# Patient Record
Sex: Female | Born: 1995 | Race: Black or African American | Hispanic: No | Marital: Single | Smoking: Never smoker
Health system: Southern US, Community
[De-identification: ages and names within clinical notes are randomized; demographics above are authoritative.]

## PROBLEM LIST (undated history)

## (undated) DIAGNOSIS — B019 Varicella without complication: Secondary | ICD-10-CM

## (undated) DIAGNOSIS — Z8613 Personal history of malaria: Secondary | ICD-10-CM

## (undated) DIAGNOSIS — G43909 Migraine, unspecified, not intractable, without status migrainosus: Secondary | ICD-10-CM

## (undated) HISTORY — PX: CHOLECYSTECTOMY, LAPAROSCOPIC: SHX56

## (undated) HISTORY — DX: Personal history of malaria: Z86.13

## (undated) HISTORY — DX: Migraine, unspecified, not intractable, without status migrainosus: G43.909

## (undated) HISTORY — DX: Varicella without complication: B01.9

---
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
## 2013-04-27 ENCOUNTER — Emergency Department (INDEPENDENT_AMBULATORY_CARE_PROVIDER_SITE_OTHER): Payer: 59

## 2013-04-27 ENCOUNTER — Encounter (HOSPITAL_COMMUNITY): Payer: Self-pay | Admitting: Emergency Medicine

## 2013-04-27 ENCOUNTER — Emergency Department (HOSPITAL_COMMUNITY)
Admission: EM | Admit: 2013-04-27 | Discharge: 2013-04-27 | Disposition: A | Discharge status: Home / Self Care | Payer: 59 | Source: Home / Self Care | Attending: Family Medicine | Admitting: Family Medicine

## 2013-04-27 DIAGNOSIS — S92109A Unspecified fracture of unspecified talus, initial encounter for closed fracture: Secondary | ICD-10-CM

## 2013-04-27 DIAGNOSIS — S92153A Displaced avulsion fracture (chip fracture) of unspecified talus, initial encounter for closed fracture: Secondary | ICD-10-CM

## 2013-04-27 NOTE — ED Notes (Signed)
Patient states was playing soccer yesterday and injured her left ankle Today swollen with some pain

## 2013-04-27 NOTE — Discharge Instructions (Signed)
Thank you for coming in today. Use the cam walker with or without crutches  Take up to 2 aleve twice daily for pain as needed.  [facility]  Ankle Fracture A fracture is a break in the bone. A cast or splint is used to protect and keep your injured bone from moving.  HOME CARE INSTRUCTIONS   Use your crutches as directed.  To lessen the swelling, keep the injured leg elevated while sitting or lying down.  Apply ice to the injury for 15-20 minutes, 03-04 times per day while awake for 2 days. Put the ice in a plastic bag and place a thin towel between the bag of ice and your cast.  If you have a plaster or fiberglass cast:  Do not try to scratch the skin under the cast using sharp or pointed objects.  Check the skin around the cast every day. You may put lotion on any red or sore areas.  Keep your cast dry and clean.  If you have a plaster splint:  Wear the splint as directed.  You may loosen the elastic around the splint if your toes become numb, tingle, or turn cold or blue.  Do not put pressure on any part of your cast or splint; it may break. Rest your cast only on a pillow the first 24 hours until it is fully hardened.  Your cast or splint can be protected during bathing with a plastic bag. Do not lower the cast or splint into water.  Take medications as directed by your caregiver. Only take over-the-counter or prescription medicines for pain, discomfort, or fever as directed by your caregiver.  Do not drive a vehicle until your caregiver specifically tells you it is safe to do so.  If your caregiver has given you a follow-up appointment, it is very important to keep that appointment. Not keeping the appointment could result in a chronic or permanent injury, pain, and disability. If there is any problem keeping the appointment, you must call back to this facility for assistance. SEEK IMMEDIATE MEDICAL CARE IF:   Your cast gets  damaged or breaks.  You have continued severe pain or more swelling than you did before the cast was put on.  Your skin or toenails below the injury turn blue or gray, or feel cold or numb.  There is a bad smell or new stains and/or purulent (pus like) drainage coming from under the cast. If you do not have a window in your cast for observing the wound, a discharge or minor bleeding may show up as a stain on the outside of your cast. Report these findings to your caregiver. MAKE SURE YOU:   Understand these instructions.  Will watch your condition.  Will get help right away if you are not doing well or get worse. Document Released: 01/14/2000 Document Revised: 04/10/2011 Document Reviewed: 08/15/2012 [facility] Patient Information 2014 [place], [place].

## 2013-04-27 NOTE — ED Provider Notes (Signed)
Jill [name] is a 18 y.o. female who presents to Urgent Care today for left ankle injury. Patient suffered an inversion injury yesterday playing soccer. She is lateral and medial ankle pain. Additionally she notes pain in her proximal fifth metatarsal. She is able to walk however pain is somewhat moderate. She denies any radiating pain weakness or numbness. She feels well otherwise.  She is a Consulting civil engineerstudent [employer]   Past Medical History  Diagnosis Date  . Chicken pox   . Migraines     menstrual trigger  . History of malaria 1995    [place] treated    History  Substance Use Topics  . Smoking status: Never Smoker   . Smokeless tobacco: Never Used  . Alcohol Use: No   ROS as above Medications: No current facility-administered medications for this encounter.   No current outpatient prescriptions on file.    Exam:  BP 124/79  Pulse 84  Temp(Src) 98.5 F (36.9 C) (Oral)  Resp 18  SpO2 96%  LMP 04/02/2013 Gen: Well NAD Left ankle: Swelling present across the anterior aspect of her ankle from the medial to lateral malleoli. She's tender at the medial talar this and ATFL insertion. She is mild tenderness at the base of the fifth metatarsal. The rest of the foot is nontender. Stable and good ligamentous testing. Motion is intact. Capillary refill sensation and pulses are intact distally.  No results found for this or any previous visit (from the past 24 hour(s)). Dg Ankle Complete Left  04/27/2013   CLINICAL DATA:  Inversion injury.  EXAM: LEFT ANKLE COMPLETE - 3+ VIEW  COMPARISON:  None.  FINDINGS: Diffuse soft tissue swelling. Linear bone density noted along the medial talus, likely avulsion fracture. No additional acute bony abnormality. No subluxation or dislocation. Joint spaces are maintained.  IMPRESSION: Small avulsion fracture off the medial talus.   Electronically Signed   By: Charlett NoseKevin  Dover M.D.   On: 04/27/2013 14:45   Dg Foot Complete Left  04/27/2013   CLINICAL DATA:   Right foot injury.  Pain.  EXAM: LEFT FOOT - COMPLETE 3+ VIEW  COMPARISON:  None.  FINDINGS: There is no evidence of fracture or dislocation. There is no evidence of arthropathy or other focal bone abnormality. Soft tissues are unremarkable.  IMPRESSION: Negative.   Electronically Signed   By: Amie Portlandavid  Ormond M.D.   On: 04/27/2013 14:45    Assessment and Plan: 18 y.o. female with avulsion fracture to the medial talus.  Plan for Cam Walker boot, ibuprofen and [facility]   Discussed warning signs or symptoms. Please see discharge instructions. Patient expresses understanding.    Rodolph BongEvan S Corey, MD 04/27/13 [phone]

## 2014-02-28 IMAGING — CR DG CHEST 2V
2 series · 2 of 2 positions shown · non-contrast
Comparison: None.

CLINICAL DATA: History of positive PPD test.

CHEST - 2 VIEW

[view not recorded (1 of 2)]
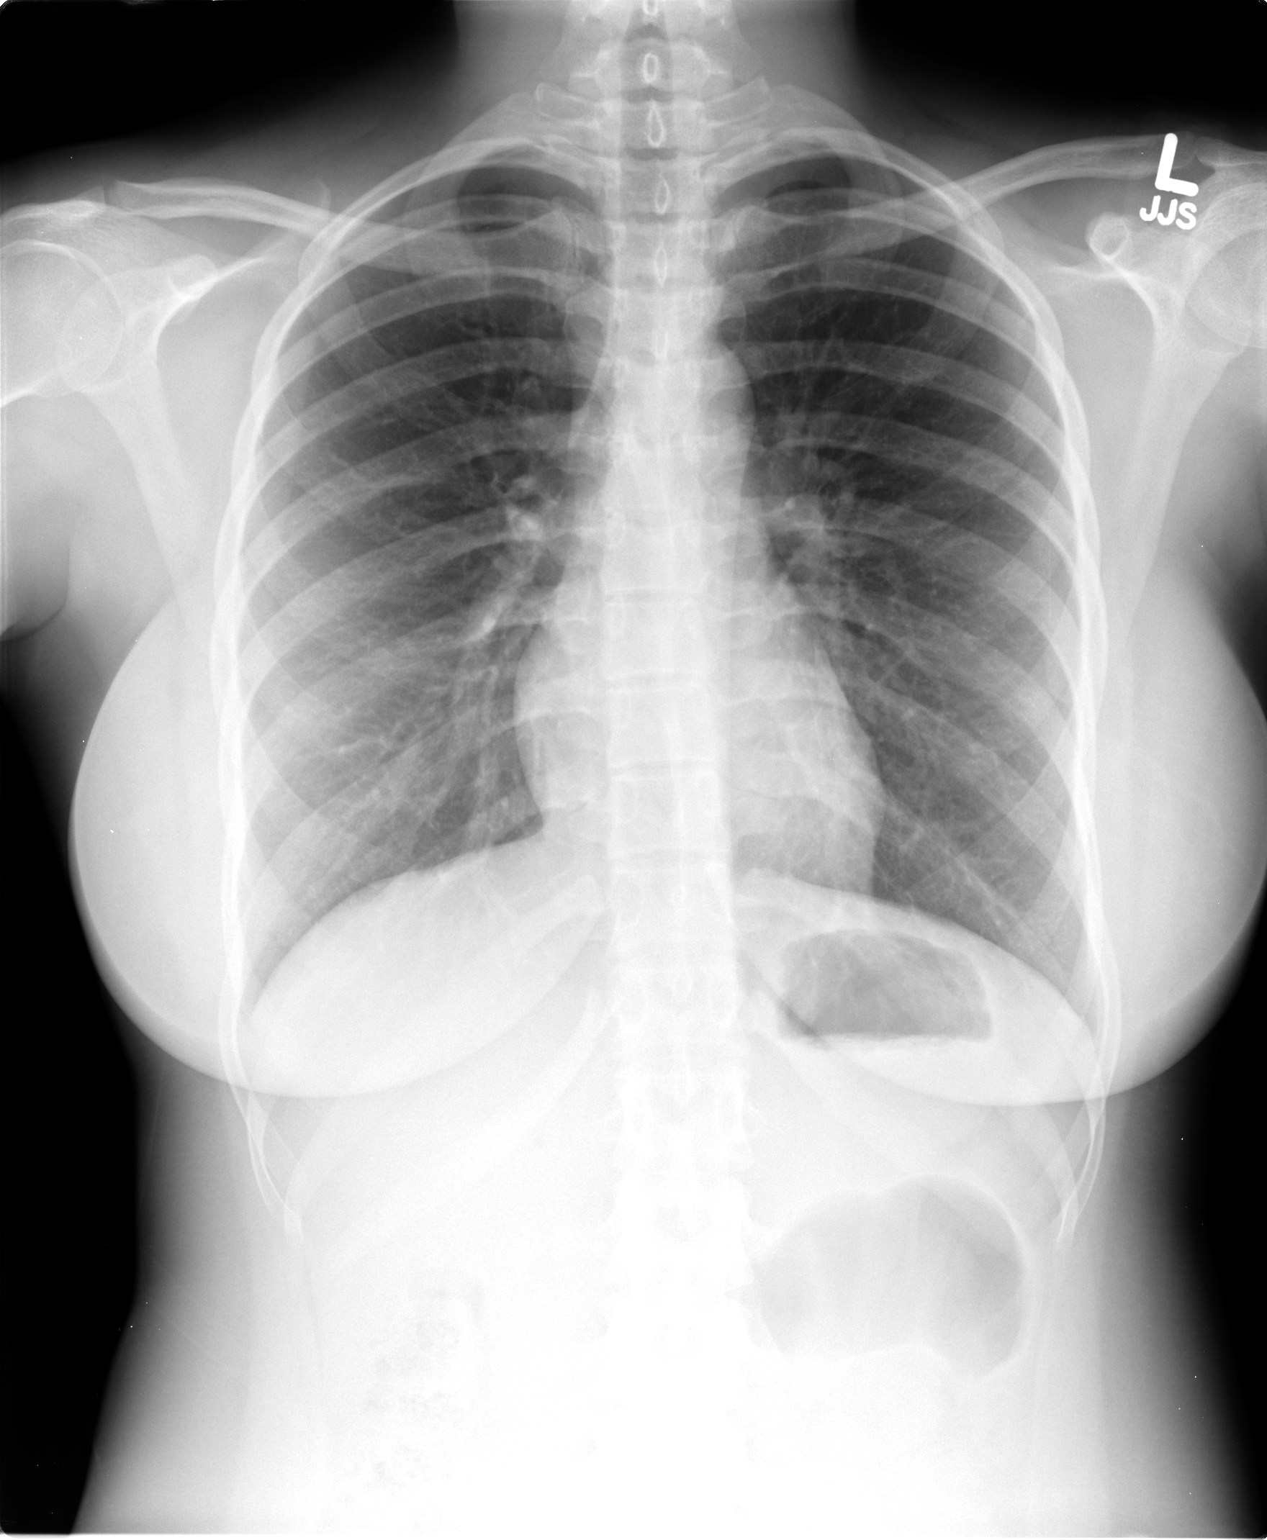

[view not recorded (2 of 2)]
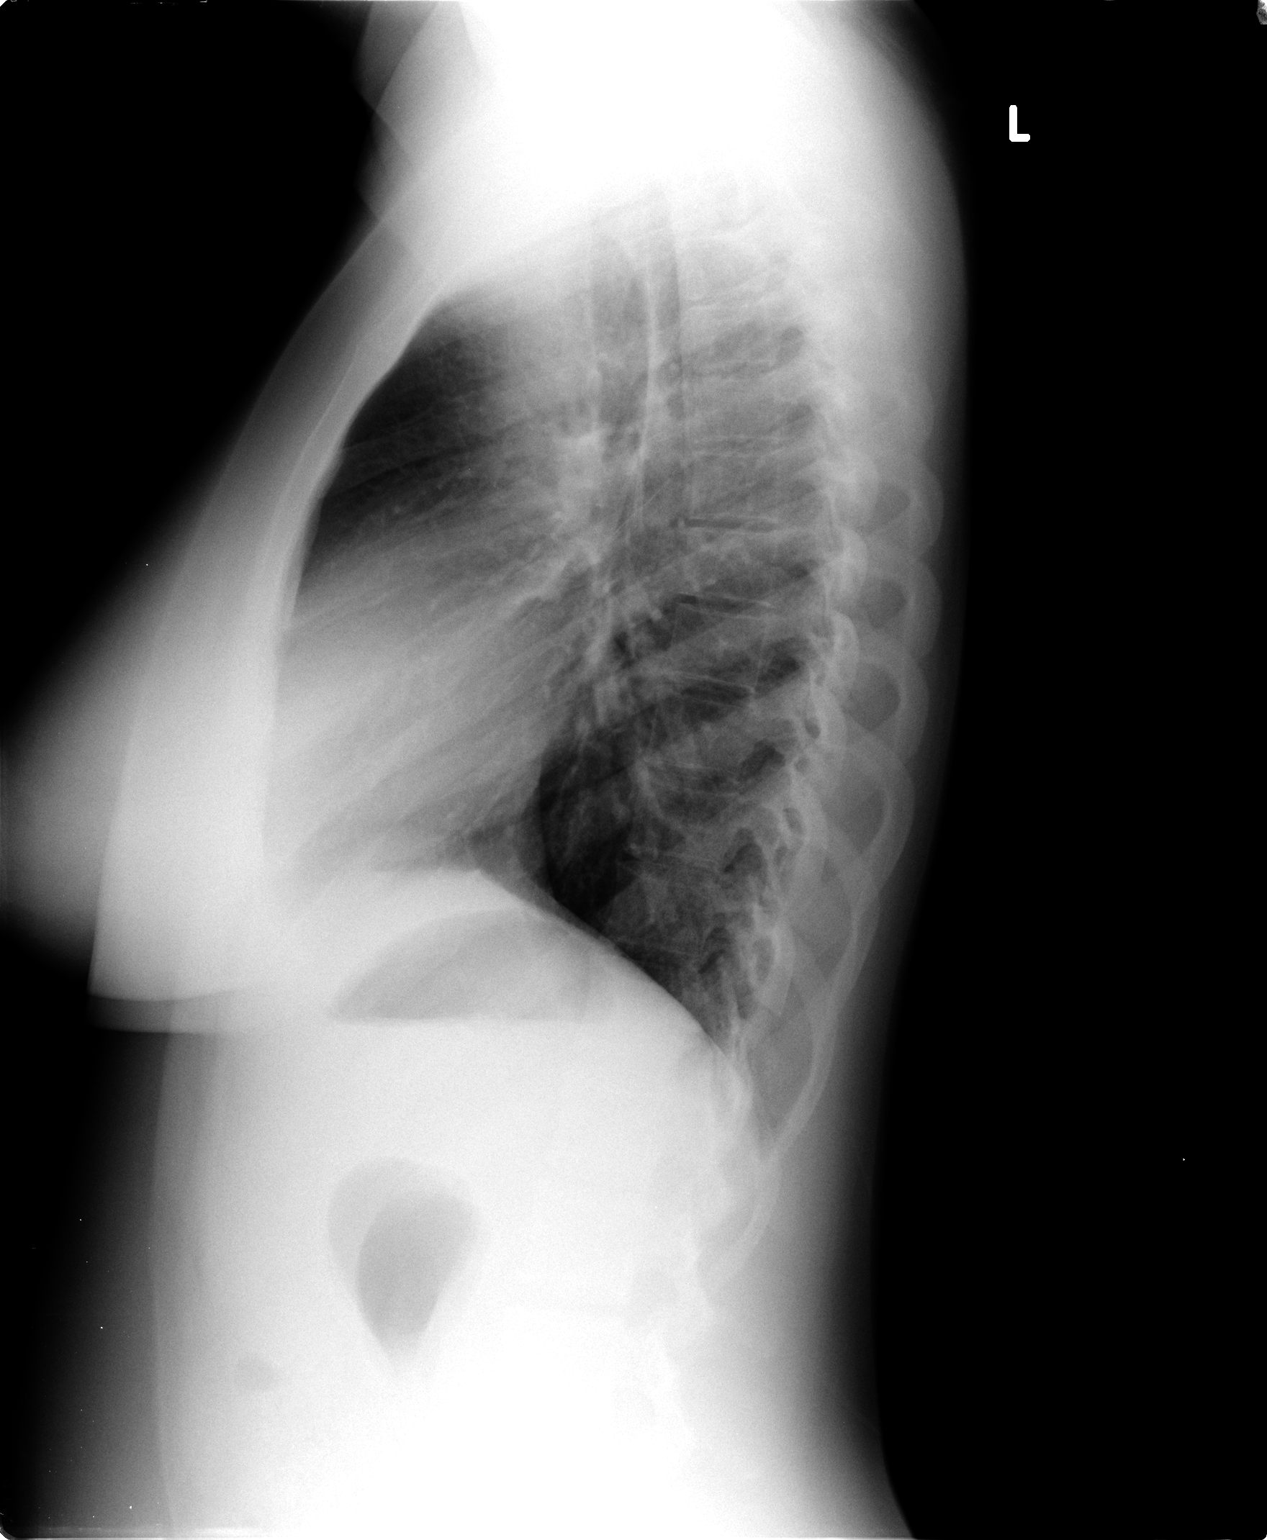

[2 of 2 positions shown; findings below may reference images not displayed]

FINDINGS: The cardiac silhouette is normal size and shape.  No
hilar or mediastinal abnormality is evident.  No pulmonary
infiltrates or nodules were evident. No pleural abnormality is
evident.  There is subtle slight lumbar scoliosis convexity to the
left.
IMPRESSION: No acute or active cardiopulmonary or pleural abnormalities are
evident.  Subtle slight scoliosis.

There is no evidence of tuberculosis.

## 2015-02-17 DIAGNOSIS — L0102 Bockhart's impetigo: Secondary | ICD-10-CM | POA: Diagnosis not present

## 2015-03-01 DIAGNOSIS — L810 Postinflammatory hyperpigmentation: Secondary | ICD-10-CM | POA: Diagnosis not present

## 2015-06-01 DIAGNOSIS — Z0184 Encounter for antibody response examination: Secondary | ICD-10-CM | POA: Diagnosis not present

## 2015-08-11 DIAGNOSIS — L259 Unspecified contact dermatitis, unspecified cause: Secondary | ICD-10-CM | POA: Diagnosis not present

## 2015-11-15 DIAGNOSIS — R197 Diarrhea, unspecified: Secondary | ICD-10-CM | POA: Diagnosis not present

## 2015-11-15 DIAGNOSIS — K8010 Calculus of gallbladder with chronic cholecystitis without obstruction: Secondary | ICD-10-CM | POA: Diagnosis not present

## 2015-11-15 DIAGNOSIS — R112 Nausea with vomiting, unspecified: Secondary | ICD-10-CM | POA: Diagnosis not present

## 2015-11-15 DIAGNOSIS — K8020 Calculus of gallbladder without cholecystitis without obstruction: Secondary | ICD-10-CM | POA: Diagnosis not present

## 2015-11-15 DIAGNOSIS — K529 Noninfective gastroenteritis and colitis, unspecified: Secondary | ICD-10-CM | POA: Diagnosis not present

## 2015-11-15 DIAGNOSIS — R1013 Epigastric pain: Secondary | ICD-10-CM | POA: Diagnosis not present

## 2015-12-13 DIAGNOSIS — K8020 Calculus of gallbladder without cholecystitis without obstruction: Secondary | ICD-10-CM | POA: Diagnosis not present

## 2015-12-21 DIAGNOSIS — K8070 Calculus of gallbladder and bile duct without cholecystitis without obstruction: Secondary | ICD-10-CM | POA: Diagnosis not present

## 2015-12-21 DIAGNOSIS — R197 Diarrhea, unspecified: Secondary | ICD-10-CM | POA: Diagnosis not present

## 2015-12-21 DIAGNOSIS — K8020 Calculus of gallbladder without cholecystitis without obstruction: Secondary | ICD-10-CM | POA: Diagnosis not present

## 2015-12-21 DIAGNOSIS — R51 Headache: Secondary | ICD-10-CM | POA: Diagnosis not present

## 2015-12-21 DIAGNOSIS — Z79899 Other long term (current) drug therapy: Secondary | ICD-10-CM | POA: Diagnosis not present

## 2015-12-21 DIAGNOSIS — K8080 Other cholelithiasis without obstruction: Secondary | ICD-10-CM | POA: Diagnosis not present

## 2016-07-10 DIAGNOSIS — L739 Follicular disorder, unspecified: Secondary | ICD-10-CM | POA: Diagnosis not present

## 2016-08-04 DIAGNOSIS — L700 Acne vulgaris: Secondary | ICD-10-CM | POA: Diagnosis not present

## 2016-11-02 ENCOUNTER — Telehealth: Payer: Self-pay | Admitting: Internal Medicine

## 2016-11-02 NOTE — Telephone Encounter (Signed)
Pt is calling wanting to see if Dr. Fabian Sharp will continue to see her last seen 07/18/11?

## 2016-11-02 NOTE — Telephone Encounter (Signed)
sures  No need to ask  For  Those under 26

## 2016-11-03 NOTE — Telephone Encounter (Signed)
Pt has been scheduled.  °

## 2016-11-07 NOTE — Progress Notes (Signed)
Chief Complaint  Patient presents with  . Acute Visit    Pt c/o lightheadedness and weakness x  2 weeks. Pt repors that this has happened before about 2-3 months ago    HPI: Jill [name] 21 y.o. 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 Had some symptoms that she would like to get checked out since she is home on break. First noted July felt light headed and weak feeling not associated with any particular thing although could be with activity. No chest pain shortness of breath syncope bleeding. and then  Recently   OK today.    nonted when walk to class  And when sit down . Realize it.  Head pounding with each step .    Sleep ok.  No hx of fainting near fainting  X  Overheated  In jan and passesd out    diet not restricted smaller portion and no fat . 6does notice she probably isn't drinking enough fluids. She is very busy and does have stress but no acute panic and depression. Periods    Monthly and last 6- 7 days .   LMP Sept 18th.  [facility] 2017 [facility]  Nl path Tobacco, etoh: no, no sugar ,  Rd .   Sleep 6- 7 hours.  ROS: See pertinent positives and negatives per HPI.no fever unintended weight loss vomiting diarrhea neurologic symptoms.denies risk of pregnancy.no racing heart or irregular heartbeat. Negative family history of arrhythmia.  Past Medical History:  Diagnosis Date  . Chicken pox   . History of malaria 1995   [place] treated   . Migraines    menstrual trigger    Family History  Problem Relation Age of Onset  . Stroke Maternal Grandmother   . Hypertension Maternal Grandmother   . Hypertension Paternal Grandmother   . Migraines Mother    Past Surgical History:  Procedure Laterality Date  . CHOLECYSTECTOMY, LAPAROSCOPIC     2017     Social History   Social History  . Marital status: Single    Spouse name: N/A  . Number of children: N/A  . Years of education:  N/A   Social History Main Topics  . Smoking status: Never Smoker  . Smokeless tobacco: Never Used  . Alcohol use No  . Drug use: Unknown  . Sexual activity: Not Asked   Other Topics Concern  . None   Social History Narrative   8 hours of sleep   hh of 6    Medical family    Awaiting from [employer] high school.   Originally born in [place].   Neg tad       Patient plans on going to [employer] in the fall  Poss global studies and public health.   Dental school at [employer]    No outpatient prescriptions prior to visit.   No facility-administered medications prior to visit.      EXAM:  BP 102/66 (BP Location: Right Arm, Patient Position: Sitting, Cuff Size: Normal)   Pulse 71   Temp 98.5 F (36.9 C) (Oral)   Wt 118 lb 12.8 oz (53.9 kg)   LMP 10/17/2016 (Exact Date)   BMI 21.73 kg/m   Body mass index is 21.73 kg/m. Blood pressure check sitting and standing no major orthostatic changes. Pulse in the 70-80 range.  GENERAL: vitals reviewed and listed above, alert, oriented, appears well hydrated and in no acute distress HEENT: atraumatic, conjunctiva  clear, no obvious abnormalities on inspection of external nose and ears OP : no lesion edema or exudate TMs intact and normal. NECK: no obvious masses on inspection palpation easy to feel thyroid LUNGS: clear to auscultation bilaterally, no wheezes, rales or rhonchi, good air movement CV: HRRR, no clubbing cyanosis or  peripheral edema nl cap refill  bdomen soft without again medically guarding or rebound MS: moves all extremities without noticeable focal  Abnormality Neurologic is grossly intact. PSYCH: pleasant and cooperative, no obvious depression or anxiety Lab Results  Component Value Date   WBC 5.1 11/08/2016   HGB 12.5 11/08/2016   HCT 39.4 11/08/2016   PLT 294.0 11/08/2016   GLUCOSE 83 11/08/2016   CHOL 192 07/14/2011   TRIG 45.0 07/14/2011   HDL 58.70  07/14/2011   LDLCALC 124 (H) 07/14/2011   ALT 9 11/08/2016   AST 13 11/08/2016   NA 136 11/08/2016   K 4.1 11/08/2016   CL 102 11/08/2016   CREATININE 0.73 11/08/2016   BUN 8 11/08/2016   CO2 27 11/08/2016   TSH 1.42 11/08/2016   BP Readings from Last 3 Encounters:  11/08/16 102/66  04/27/13 124/79  07/19/11 114/72    ASSESSMENT AND PLAN:  Discussed the following assessment and plan:  Episodic lightheadedness - Plan: CBC with Differential/Platelet, TSH, T4, free, POCT urine pregnancy, CMP  Need for influenza vaccination - Plan: Flu Vaccine QUAD 6+ mos PF IM (Fluarix Quad PF) Exam is good rule out metabolic suspect a hydration issue  Possible.no evidence of cardiovascular cause. Neurologic appears grossly intact. Discussed hydration and follow-up if per persistent progressive or alarm findings. She can sign up for my chart to contact us. And also review her lab tests. -Patient advised to return or notify health care team  if  new concerns arise.  Patient Instructions  Your exam is reassuring .  sign up for my chart  To review also  Will notify you  of labs when available.  Stay hydrated   And don't skip meals .      Dizziness Dizziness is a common problem. It is a feeling of unsteadiness or light-headedness. You may feel like you are about to faint. Dizziness can lead to injury if you stumble or fall. Anyone can become dizzy, but dizziness is more common in older adults. This condition can be caused by a number of things, including medicines, dehydration, or illness. Follow these instructions at home: Taking these steps may help with your condition: Eating and drinking  Drink enough fluid to keep your urine clear or pale yellow. This helps to keep you from becoming dehydrated. Try to drink more clear fluids, such as water.  Do not drink alcohol.  Limit your caffeine intake if directed by your health care provider.  Limit your salt intake if directed by your health care  provider. Activity  Avoid making quick movements. ? Rise slowly from chairs and steady yourself until you feel okay. ? In the morning, first sit up on the side of the bed. When you feel okay, stand slowly while you hold onto something until you know that your balance is fine.  Move your legs often if you need to stand in one place for a long time. Tighten and relax your muscles in your legs while you are standing.  Do not drive or operate heavy machinery if you feel dizzy.  Avoid bending down if you feel dizzy. Place items in your home so that they are easy for you to reach without leaning over. Lifestyle  Do not use any tobacco products, including cigarettes, chewing tobacco, or electronic cigarettes. If you need help quitting, ask your health care provider.  Try to reduce your stress level, such as with yoga or meditation. Talk with your health care provider if you need help. General instructions  Watch your dizziness for any changes.  Take medicines only as directed by your health care provider. Talk with your health care provider if you think that your dizziness is caused by a medicine that you are taking.  Tell a friend or a family member that you are feeling dizzy. If he or she notices any changes in your behavior, have this person call your health care provider.  Keep all follow-up visits as directed by your health care provider. This is important. Contact a health care provider if:  Your dizziness does not go away.  Your dizziness or light-headedness gets worse.  You feel nauseous.  You have reduced hearing.  You have new symptoms.  You are unsteady on your feet or you feel like the room is spinning. Get help right away if:  You vomit or have diarrhea and are unable to eat or drink anything.  You have problems talking, walking, swallowing, or using your arms, hands, or legs.  You feel generally weak.  You are not thinking clearly or you have trouble forming  sentences. It may take a friend or family member to notice this.  You have chest pain, abdominal pain, shortness of breath, or sweating.  Your vision changes.  You notice any bleeding.  You have a headache.  You have neck pain or a stiff neck.  You have a fever. This information is not intended to replace advice given to you by your health care provider. Make sure you discuss any questions you have with your health care provider. Document Released: 07/12/2000 Document Revised: 06/24/2015 Document Reviewed: 01/12/2014 Elsevier Interactive Patient Education  2017 [place]. Joseangel Nettleton M.D.

## 2016-11-08 ENCOUNTER — Ambulatory Visit (INDEPENDENT_AMBULATORY_CARE_PROVIDER_SITE_OTHER): Payer: 59 | Admitting: Internal Medicine

## 2016-11-08 ENCOUNTER — Encounter: Payer: Self-pay | Admitting: Internal Medicine

## 2016-11-08 VITALS — BP 102/66 | HR 71 | Temp 98.5°F | Wt 118.8 lb

## 2016-11-08 DIAGNOSIS — Z23 Encounter for immunization: Secondary | ICD-10-CM | POA: Diagnosis not present

## 2016-11-08 DIAGNOSIS — R42 Dizziness and giddiness: Secondary | ICD-10-CM

## 2016-11-08 LAB — CBC WITH DIFFERENTIAL/PLATELET
BASOS ABS: 0.1 10*3/uL (ref 0.0–0.1)
Basophils Relative: 1.1 % (ref 0.0–3.0)
Eosinophils Absolute: 0.1 10*3/uL (ref 0.0–0.7)
Eosinophils Relative: 2.4 % (ref 0.0–5.0)
HEMATOCRIT: 39.4 % (ref 36.0–46.0)
Hemoglobin: 12.5 g/dL (ref 12.0–15.0)
LYMPHS PCT: 49.4 % — AB (ref 12.0–46.0)
Lymphs Abs: 2.5 10*3/uL (ref 0.7–4.0)
MCHC: 31.7 g/dL (ref 30.0–36.0)
MCV: 87.1 fl (ref 78.0–100.0)
MONOS PCT: 7.8 % (ref 3.0–12.0)
Monocytes Absolute: 0.4 10*3/uL (ref 0.1–1.0)
Neutro Abs: 2 10*3/uL (ref 1.4–7.7)
Neutrophils Relative %: 39.3 % — ABNORMAL LOW (ref 43.0–77.0)
Platelets: 294 10*3/uL (ref 150.0–400.0)
RBC: 4.52 Mil/uL (ref 3.87–5.11)
RDW: 13.4 % (ref 11.5–15.5)
WBC: 5.1 10*3/uL (ref 4.0–10.5)

## 2016-11-08 LAB — COMPREHENSIVE METABOLIC PANEL
ALK PHOS: 58 U/L (ref 39–117)
ALT: 9 U/L (ref 0–35)
AST: 13 U/L (ref 0–37)
Albumin: 4.6 g/dL (ref 3.5–5.2)
BILIRUBIN TOTAL: 0.4 mg/dL (ref 0.2–1.2)
BUN: 8 mg/dL (ref 6–23)
CO2: 27 meq/L (ref 19–32)
CREATININE: 0.73 mg/dL (ref 0.40–1.20)
Calcium: 10.2 mg/dL (ref 8.4–10.5)
Chloride: 102 mEq/L (ref 96–112)
GFR: 126.61 mL/min (ref >60.00)
GLUCOSE: 83 mg/dL (ref 70–99)
Potassium: 4.1 mEq/L (ref 3.5–5.1)
SODIUM: 136 meq/L (ref 135–145)
TOTAL PROTEIN: 7.9 g/dL (ref 6.0–8.3)

## 2016-11-08 LAB — POCT URINE PREGNANCY: PREG TEST UR: NEGATIVE

## 2016-11-08 LAB — TSH: TSH: 1.42 u[IU]/mL (ref 0.35–4.50)

## 2016-11-08 LAB — T4, FREE: Free T4: 0.88 ng/dL (ref 0.60–1.60)

## 2016-11-08 NOTE — Patient Instructions (Signed)
Your exam is reassuring .  sign up for my chart  To review also  Will notify you  of labs when available.  Stay hydrated   And don't skip meals .      Dizziness Dizziness is a common problem. It is a feeling of unsteadiness or light-headedness. You may feel like you are about to faint. Dizziness can lead to injury if you stumble or fall. Anyone can become dizzy, but dizziness is more common in older adults. This condition can be caused by a number of things, including medicines, dehydration, or illness. Follow these instructions at home: Taking these steps may help with your condition: Eating and drinking  Drink enough fluid to keep your urine clear or pale yellow. This helps to keep you from becoming dehydrated. Try to drink more clear fluids, such as water.  Do not drink alcohol.  Limit your caffeine intake if directed by your health care provider.  Limit your salt intake if directed by your health care provider. Activity  Avoid making quick movements. ? Rise slowly from chairs and steady yourself until you feel okay. ? In the morning, first sit up on the side of the bed. When you feel okay, stand slowly while you hold onto something until you know that your balance is fine.  Move your legs often if you need to stand in one place for a long time. Tighten and relax your muscles in your legs while you are standing.  Do not drive or operate heavy machinery if you feel dizzy.  Avoid bending down if you feel dizzy. Place items in your home so that they are easy for you to reach without leaning over. Lifestyle  Do not use any tobacco products, including cigarettes, chewing tobacco, or electronic cigarettes. If you need help quitting, ask your health care provider.  Try to reduce your stress level, such as with yoga or meditation. Talk with your health care provider if you need help. General instructions  Watch your dizziness for any changes.  Take medicines only as directed by your  health care provider. Talk with your health care provider if you think that your dizziness is caused by a medicine that you are taking.  Tell a friend or a family member that you are feeling dizzy. If he or she notices any changes in your behavior, have this person call your health care provider.  Keep all follow-up visits as directed by your health care provider. This is important. Contact a health care provider if:  Your dizziness does not go away.  Your dizziness or light-headedness gets worse.  You feel nauseous.  You have reduced hearing.  You have new symptoms.  You are unsteady on your feet or you feel like the room is spinning. Get help right away if:  You vomit or have diarrhea and are unable to eat or drink anything.  You have problems talking, walking, swallowing, or using your arms, hands, or legs.  You feel generally weak.  You are not thinking clearly or you have trouble forming sentences. It may take a friend or family member to notice this.  You have chest pain, abdominal pain, shortness of breath, or sweating.  Your vision changes.  You notice any bleeding.  You have a headache.  You have neck pain or a stiff neck.  You have a fever. This information is not intended to replace advice given to you by your health care provider. Make sure you discuss any questions you have with  your health care provider. Document Released: 07/12/2000 Document Revised: 06/24/2015 Document Reviewed: 01/12/2014 Elsevier Interactive Patient Education  2017 [employer].

## 2016-11-17 ENCOUNTER — Telehealth: Payer: Self-pay | Admitting: Internal Medicine

## 2016-11-17 NOTE — Telephone Encounter (Signed)
Pt is calling need proof that she had the flu shot and would like to have it faxed (pt will call back with the fax #)

## 2016-11-17 NOTE — Telephone Encounter (Signed)
Please fax to [phone] attn misti

## 2016-11-20 NOTE — Telephone Encounter (Signed)
Vaccine verification printed and faxed to number below.  Pt mother aware and will relay to patient. Nothing further needed.

## 2016-11-22 ENCOUNTER — Encounter: Payer: Self-pay | Admitting: Emergency Medicine

## 2017-11-13 DIAGNOSIS — Z23 Encounter for immunization: Secondary | ICD-10-CM | POA: Diagnosis not present

## 2018-07-22 DIAGNOSIS — Z1159 Encounter for screening for other viral diseases: Secondary | ICD-10-CM | POA: Diagnosis not present

## 2018-07-22 DIAGNOSIS — Z0184 Encounter for antibody response examination: Secondary | ICD-10-CM | POA: Diagnosis not present

## 2019-05-17 ENCOUNTER — Other Ambulatory Visit: Payer: Self-pay

## 2019-05-17 ENCOUNTER — Emergency Department (HOSPITAL_COMMUNITY)
Admission: EM | Admit: 2019-05-17 | Discharge: 2019-05-17 | Disposition: A | Discharge status: Home / Self Care | Payer: 59 | Attending: Emergency Medicine | Admitting: Emergency Medicine

## 2019-05-17 ENCOUNTER — Encounter (HOSPITAL_COMMUNITY): Payer: Self-pay | Admitting: Emergency Medicine

## 2019-05-17 DIAGNOSIS — Y92010 Kitchen of single-family (private) house as the place of occurrence of the external cause: Secondary | ICD-10-CM | POA: Insufficient documentation

## 2019-05-17 DIAGNOSIS — W260XXA Contact with knife, initial encounter: Secondary | ICD-10-CM | POA: Diagnosis not present

## 2019-05-17 DIAGNOSIS — Y93G3 Activity, cooking and baking: Secondary | ICD-10-CM | POA: Diagnosis not present

## 2019-05-17 DIAGNOSIS — S61210A Laceration without foreign body of right index finger without damage to nail, initial encounter: Secondary | ICD-10-CM | POA: Insufficient documentation

## 2019-05-17 DIAGNOSIS — Y999 Unspecified external cause status: Secondary | ICD-10-CM | POA: Insufficient documentation

## 2019-05-17 MED ORDER — TRANEXAMIC ACID FOR EPISTAXIS
500.0000 mg | Freq: Once | TOPICAL | Status: AC
  Administered 2019-05-17: 22:00:00 500 mg via TOPICAL
  Filled 2019-05-17 (×2): qty 10

## 2019-05-17 NOTE — ED Provider Notes (Signed)
[facility] EMERGENCY DEPARTMENT Provider Note   CSN: [id number] Arrival date & time: 05/17/19  1947     History Chief Complaint  Patient presents with  . Laceration    Jill [name] is a 24 y.o. female presenting to ED with accidental laceration to tip of her right 2nd finger.  Cut herself with a knife cooking this evening.  Reports lots of bleeding earlier but it's slowed down with pressure.  Not on A/C.    HPI     Past Medical History:  Diagnosis Date  . Chicken pox   . History of malaria 1995   [place] treated   . Migraines    menstrual trigger    Patient Active Problem List   Diagnosis Date Noted  . Positive PPD  travel to [place]   infant bcg   07/19/2011  . Visit for preventive health examination 07/05/2011  . Migraines   . History of malaria     Past Surgical History:  Procedure Laterality Date  . CHOLECYSTECTOMY, LAPAROSCOPIC     2017     OB History   No obstetric history on file.     Family History  Problem Relation Age of Onset  . Stroke Maternal Grandmother   . Hypertension Maternal Grandmother   . Hypertension Paternal Grandmother   . Migraines Mother     Social History   Tobacco Use  . Smoking status: Never Smoker  . Smokeless tobacco: Never Used  Substance Use Topics  . Alcohol use: No  . Drug use: Not on file    Home Medications Prior to Admission medications   Not on File    Allergies    Patient has no known allergies.  Review of Systems   Review of Systems  Musculoskeletal: Positive for arthralgias and myalgias.  Skin: Positive for wound. Negative for rash.  Neurological: Negative for syncope and light-headedness.  Psychiatric/Behavioral: Negative for agitation and confusion.    Physical Exam Updated Vital Signs BP 112/64 (BP Location: Right Arm)   Pulse 72   Temp 98.7 F (37.1 C) (Oral)   Resp 16   Ht 5\' 2"  (1.575 m)   Wt 56.7 kg   SpO2 98%   BMI 22.86 kg/m   Physical Exam Vitals and  nursing note reviewed.  Constitutional:      General: She is not in acute distress.    Appearance: She is well-developed.  HENT:     Head: Normocephalic and atraumatic.  Eyes:     Conjunctiva/sclera: Conjunctivae normal.     Pupils: Pupils are equal, round, and reactive to light.  Cardiovascular:     Rate and Rhythm: Normal rate and regular rhythm.     Pulses: Normal pulses.  Pulmonary:     Effort: Pulmonary effort is normal. No respiratory distress.  Musculoskeletal:     Cervical back: Neck supple.  Skin:    General: Skin is warm and dry.     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     Mental Status: She is alert and oriented to person, place, and time.  Psychiatric:        Mood and Affect: Mood normal.        Behavior: Behavior normal.     ED Results / Procedures / Treatments   Labs (all labs ordered are listed,  but only abnormal results are displayed) Labs Reviewed - No data to display  EKG None  Radiology No results found.  Procedures Procedures (including critical care time)  Medications Ordered in ED Medications  tranexamic acid (CYKLOKAPRON) 1000 MG/10ML topical solution 500 mg (500 mg Topical Given 05/17/19 2157)    ED Course  I have reviewed the triage vital signs and the nursing notes.  Pertinent labs & imaging results that were available during my care of the patient were reviewed by me and considered in my medical decision making (see chart for details).  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  No arterial injury.  Amputated tip wasn't brought in and I suspect much too small to suture in place if she had.  I applied topical txa and another dressing.  Advised her it may ooze for 24 hours but I expect it will scar over rapidly, leaving a small permanent deformity here, but it  should not interfere with her daily activities.    No sign of infection or tendon violation.  Okay for discharge.    Final Clinical Impression(s) / ED Diagnoses Final diagnoses:  Laceration of right index finger without foreign body without damage to nail, initial encounter    Rx / DC Orders ED Discharge Orders    None       Angelice Piech, Carola Rhine, MD 05/18/19 [phone]

## 2019-05-17 NOTE — ED Notes (Signed)
Provider in triage 

## 2019-05-17 NOTE — Discharge Instructions (Addendum)
Keep your wound dry for 24 hours, then you can wash your hands normally.

## 2019-05-17 NOTE — ED Triage Notes (Signed)
Pt was cutting fruit, reports she sliced her pointer finger on her right hand.  Bleeding controlled

## 2022-08-11 ENCOUNTER — Encounter: Payer: Commercial Managed Care - PPO | Admitting: Family Medicine

## 2022-10-30 ENCOUNTER — Telehealth: Payer: Self-pay

## 2022-10-30 ENCOUNTER — Encounter: Payer: PRIVATE HEALTH INSURANCE | Admitting: Family Medicine

## 2022-10-30 NOTE — Telephone Encounter (Signed)
Called pt x2 to confirm upcoming appointment after missed automatic messages. Pt did not confirm appt
